# Patient Record
Sex: Female | Born: 1950 | Race: White | Hispanic: No | Marital: Single
Health system: Southern US, Community
[De-identification: ages and names within clinical notes are randomized; demographics above are authoritative.]

---

## 2000-12-24 ENCOUNTER — Other Ambulatory Visit: Admission: RE | Admit: 2000-12-24 | Discharge: 2000-12-24 | Payer: Self-pay | Admitting: Family Medicine

## 2001-05-09 ENCOUNTER — Emergency Department (HOSPITAL_COMMUNITY): Admission: EM | Admit: 2001-05-09 | Discharge: 2001-05-09 | Payer: Self-pay | Admitting: Emergency Medicine

## 2001-05-09 ENCOUNTER — Encounter: Payer: Self-pay | Admitting: Emergency Medicine

## 2001-08-19 ENCOUNTER — Ambulatory Visit (HOSPITAL_BASED_OUTPATIENT_CLINIC_OR_DEPARTMENT_OTHER): Admission: RE | Admit: 2001-08-19 | Discharge: 2001-08-19 | Payer: Self-pay | Admitting: Plastic Surgery

## 2002-02-02 ENCOUNTER — Encounter: Payer: Self-pay | Admitting: Family Medicine

## 2002-02-02 ENCOUNTER — Encounter: Admission: RE | Admit: 2002-02-02 | Discharge: 2002-02-02 | Payer: Self-pay | Admitting: Family Medicine

## 2002-07-22 ENCOUNTER — Ambulatory Visit (HOSPITAL_COMMUNITY): Admission: RE | Admit: 2002-07-22 | Discharge: 2002-07-22 | Payer: Self-pay | Admitting: Gastroenterology

## 2005-09-17 ENCOUNTER — Emergency Department (HOSPITAL_COMMUNITY): Admission: EM | Admit: 2005-09-17 | Discharge: 2005-09-17 | Payer: Self-pay | Admitting: Emergency Medicine

## 2005-09-27 ENCOUNTER — Ambulatory Visit: Payer: Self-pay | Admitting: Internal Medicine

## 2005-10-05 ENCOUNTER — Ambulatory Visit: Payer: Self-pay

## 2005-10-05 ENCOUNTER — Encounter: Payer: Self-pay | Admitting: Cardiology

## 2005-10-15 ENCOUNTER — Ambulatory Visit: Payer: Self-pay | Admitting: Internal Medicine

## 2005-10-15 ENCOUNTER — Ambulatory Visit: Payer: Self-pay | Admitting: Cardiology

## 2005-10-25 ENCOUNTER — Ambulatory Visit: Payer: Self-pay | Admitting: Cardiology

## 2005-11-01 ENCOUNTER — Ambulatory Visit: Payer: Self-pay | Admitting: Cardiology

## 2005-11-06 ENCOUNTER — Ambulatory Visit: Payer: Self-pay | Admitting: Internal Medicine

## 2005-11-08 ENCOUNTER — Ambulatory Visit: Payer: Self-pay | Admitting: Cardiology

## 2005-11-12 ENCOUNTER — Ambulatory Visit (HOSPITAL_COMMUNITY): Admission: AD | Admit: 2005-11-12 | Discharge: 2005-11-13 | Payer: Self-pay | Admitting: Internal Medicine

## 2005-11-12 ENCOUNTER — Ambulatory Visit: Payer: Self-pay | Admitting: Internal Medicine

## 2005-11-14 ENCOUNTER — Ambulatory Visit: Payer: Self-pay | Admitting: Cardiology

## 2005-11-26 ENCOUNTER — Ambulatory Visit: Payer: Self-pay | Admitting: Internal Medicine

## 2005-12-13 ENCOUNTER — Ambulatory Visit: Payer: Self-pay | Admitting: Internal Medicine

## 2005-12-13 ENCOUNTER — Ambulatory Visit: Payer: Self-pay | Admitting: Cardiology

## 2007-01-16 ENCOUNTER — Ambulatory Visit: Payer: Self-pay | Admitting: Internal Medicine

## 2007-01-21 ENCOUNTER — Ambulatory Visit: Payer: Self-pay

## 2009-03-15 ENCOUNTER — Telehealth (INDEPENDENT_AMBULATORY_CARE_PROVIDER_SITE_OTHER): Payer: Self-pay | Admitting: *Deleted

## 2009-09-30 ENCOUNTER — Encounter: Admission: RE | Admit: 2009-09-30 | Discharge: 2009-09-30 | Payer: Self-pay | Admitting: Internal Medicine

## 2010-09-05 NOTE — Assessment & Plan Note (Signed)
[facility]                         ELECTROPHYSIOLOGY OFFICE NOTE   EDRIS, SCHNECK                       MRN:          [id number]  DATE:01/16/2007                            DOB:          1950/09/13    [name] returns today after over a year's absence.  She is a very  pleasant 60 year old woman with a history of electrophysiologic study  and catheter ablation of atrial flutter which was carried out back in  July 2007.  We stopped her Coumadin and she did well.  She notes and is  here today after being re-referred by Dr. Duaine Dredge because she notes an  episode of substernal chest pressure which occurred very briefly when  she overdid it while walking several weeks ago.  Since then she has  continued to walk on a regular basis and had no significant symptoms.  Otherwise she had no specific complaints today.  She has had no syncope.  She does note when she exercises hard that her heart speeds up but then  slows back down.  She thinks that the heart rate, however, is faster  than normal.   PHYSICAL EXAMINATION:  GENERAL:  She is a pleasant well-appearing woman  in no acute distress.  VITAL SIGNS:  Blood pressure 112/78, pulse 84 and regular, respirations  16, weight 114 pounds.  NECK:  Revealed no jugular venous distention.  LUNGS:  Clear bilaterally to auscultation.  No wheezes, rales, or  rhonchi present.  CARDIOVASCULAR:  Revealed a regular rate and rhythm with normal S1 and  S2.  EXTREMITIES:  Demonstrated no edema.   EKG demonstrates a sinus rhythm with normal axes and intervals.   IMPRESSION:  1. Symptomatic atrial flutter, status post ablation.  2. New onset of exertional chest pain.   DISCUSSION:  Overall, Tara Orr is fairly stable but because of the  exertional chest pain and because of her history of known heart disease  with prior atrial flutter, I will plan on having her obtain an exercise  Myoview stress test.  Additional  recommendations will be based on the  results of her stress test.     Doylene Canning. Ladona Ridgel, MD  Electronically Signed    GWT/MedQ  DD: 01/16/2007  DT: 01/16/2007  Job #: [id number]   cc:   Mosetta Putt, M.D.

## 2010-09-08 NOTE — Op Note (Signed)
NAMEGEONNA, LOCKYER                ACCOUNT NO.:  [id number]   MEDICAL RECORD NO.:  [id number]          PATIENT TYPE:  INP   LOCATION:  2807                         FACILITY:  [facility]   PHYSICIAN:  Doylene Canning. Ladona Ridgel, M.D.  DATE OF BIRTH:  [birth year]   DATE OF PROCEDURE:  11/12/2005  DATE OF DISCHARGE:                                 OPERATIVE REPORT   PROCEDURE PERFORMED:  Electrophysiologic study and RF catheter ablation of  atrial flutter.   INDICATION:  Symptomatic atrial flutter.   INTRODUCTION:  The patient is a 60 year old woman who has a history of  atrial flutter in the past.  She has a history of depression and difficulty  with sleeping.  She was subsequently found to have atrial flutter with  preserved LV function.  She is referred for electrophysiologic study and  catheter ablation of her flutter.   PROCEDURE:  After informed consent was obtained, the patient taken  diagnostic EP lab in fasting state.  After usual preparation and draping,  intravenous fentanyl and Midazolam was given for sedation.  A 6-French  hexapolar catheter was inserted percutaneously in the right jugular vein and  advanced coronary sinus.  A 5-French quadripolar catheter was inserted  percutaneously in the right femoral vein and advanced the His bundle region.  A 7-French 20 pole halo catheter was inserted percutaneously in the right  femoral vein and advanced the right atrium.  Mapping was carried out  demonstrating clockwise atrial flutter cycle length of 230 milliseconds.  Mapping subsequently demonstrated a fairly small atrial flutter isthmus and  right atrium in general.  The 7-French quadripolar ablation catheter was  inserted into the right femoral vein and advanced into the right atrium.  Mapping was again carried out and a single RF energy application was  delivered to the isthmus between the tricuspid annulus and the eustachian  ridge resulting in termination of atrial flutter, restoration  sinus rhythm.  Pacing was then carried out from the coronary sinus demonstrating isthmus  conduction be present.  A single RF energy application was then delivered  resulting in isthmus block.  A final bonus RF energy application was then  delivered, the patient was observed for 30 minutes.  During this time, rapid  ventricular pacing was carried out from the RV apex and stepwise decreased  down to 300 milliseconds where VA conduction remained present.  The atrial  activation was midline and decremental.  Next programmed ventricular  stimulation was carried out from the RV apex at base drive cycle of 161  milliseconds with the S1-S2 interval stepwise decreased from 400  milliseconds down to 230 milliseconds where the retrograde AV node ERP was  observed.  During programmed atrial stimulation there no AH jumps no echo  beats.  Next rapid atrial pacing was carried out from the coronary sinus at  a pace cycle length 500 milliseconds stepwise decreased down to 410  milliseconds where AV Wenckebach was observed.  During rapid atrial pacing  the PR interval was less than the RR interval and there was no inducible  SVT.  Next programmed atrial stimulation  was carried out from the coronary  sinus and high right atrium base drive cycle length of 478 milliseconds.  The S1-S2 interval stepwise decreased from 440 milliseconds down to 320  milliseconds where the AV node ERP was observed.  During programmed atrial  stimulation there no AH jumps and no echo beats.  At this point a total of  30 minutes gone since development of isthmus block and rapid pacing  demonstrated no isthmus conduction.  The catheters were removed.  Hemostasis  was assured and the patient was returned to her room in satisfactory  condition.   COMPLICATIONS:  There were no immediate procedure complications.   RESULTS:  A.  Baseline ECG.  The baseline ECG demonstrates atrial flutter  with a variable ventricular response.  B.   Baseline intervals.  HV interval was 56 milliseconds, the atrial flutter  cycle length 230 milliseconds.  E.  Rapid ventricular pacing.  Following ablation, rapid ventricular pacing  was carried out from the RV apex and decremented down to 300 milliseconds  where VA conduction was persistent.  The atrial activation was midline  decremental.  D.  Programmed ventricular stimulation.  Programmed ventricular stimulation  was carried out from the RV apex at base drive cycle of 295 milliseconds.  The S1-S2 interval was stepwise decreased down to 30 milliseconds where the  retrograde AV node ERP was observed.  During programmed ventricular  stimulation, the atrial activation was midline decremental.  E.  Rapid atrial pacing.  Rapid atrial pacing was carried out from the  coronary sinus as well as from the high right atrium at base drive cycle  length of 621 milliseconds and stepwise decreased down to 410 milliseconds  where AV Wenckebach was observed.  During rapid atrial pacing the PR  interval was less than the RR interval and there is no inducible SVT.  F.  Programmed atrial stimulation.  Programmed atrial stimulation was  carried out from the coronary sinus and high right atrium at base drive  cycle length 308 milliseconds.  The S1-S2 interval was stepwise decreased  down to 320 milliseconds where AV node ERP was observed.  During programmed  atrial stimulation there no AH jumps and echo beats and no inducible SVT.  G.  Arrhythmias observed.  1.  Atrial flutter.  Initiation present time of EP study.  Duration was      sustained.  Termination catheter ablation cycle length 232 milliseconds.      Activation was clockwise around tricuspid annulus.      1.  Mapping.  Mapping of atrial flutter demonstrated small size and          normal orientation of the atrial flutter isthmus.  Atrial flutter          was clockwise and orientation.         1.  RF energy application.  A total of three RF energy  applications              including one bonus RF energy application was delivered.  During              the first RF energy application, atrial flutter was terminated              and sinus rhythm restored.  During the second RF energy              application atrial flutter isthmus block was demonstrated and a  final bonus RF energy application was subsequently delivered.   CONCLUSIONS:  Study demonstrates successful electrophysiologic study and RF  catheter ablation of clockwise atrial flutter with a total of three RF  energy applications to the usual atrial flutter isthmus resulting  termination of flutter, restoration of sinus rhythm, and creation of  bidirectional block in the atrial flutter isthmus.           ______________________________  Doylene Canning. Ladona Ridgel, M.D.     GWT/MEDQ  D:  11/12/2005  T:  11/12/2005  Job:  [id number]   cc:   Mosetta Putt, M.D.  Fax: [phone]   Pricilla Riffle, M.D.  [address]  [address]  [place]  [place] [address]

## 2010-09-08 NOTE — Discharge Summary (Signed)
NAMECHARLANE, Tara Orr                ACCOUNT NO.:  [id number]   MEDICAL RECORD NO.:  [id number]          PATIENT TYPE:  INP   LOCATION:  4707                         FACILITY:  [facility]   PHYSICIAN:  Maple Mirza, P.A. DATE OF BIRTH:  [birth year]   DATE OF ADMISSION:  11/12/2005  DATE OF DISCHARGE:  11/13/2005                                 DISCHARGE SUMMARY   ALLERGIES:  SHE HAS NO KNOWN DRUG ALLERGIES.   TIME FOR THIS DICTATION:  30 minutes.   PRINCIPAL DIAGNOSES:  1.  Clockwise atrial flutter, mildly symptomatic.  2.  Discharge day 1 status post electrophysiology study/radiofrequency      catheter ablation of clockwise atrial flutter with block at the      cavotricuspid isthmus.  3.  Dyspnea on exertion.  4.  History of palpitations.   SECONDARY DIAGNOSES:  1.  Depression.  2.  Migraines.  3.  History of melanoma.  4.  Constipation.  5.  Sleep disorders.   PROCEDURE:  November 12, 2005:  Electrophysiology study radiofrequency catheter  ablation of clockwise atrial flutter, Dr. Lewayne Bunting.   BRIEF HISTORY:  Tara Orr is a 60 year old female.  She has a history of  palpitations.  She has a sensation that her heart races, although this is  not particularly bothersome.  She was seen by Dr. Tenny Craw, and  electrocardiogram showed what appeared to be clockwise atrial flutter.   The patient states that when she walks she gets dyspneic, but has  experienced this most of her adult life.  She recently had a 2D  echocardiogram, which showed preserved left ventricular function.  There  were no significant valvular abnormalities.  The patient never had syncope.   With regard to atrial flutter, treatment options have been discussed with  the patient, including medical therapy and the need for long-term Coumadin.  The alternative would be radiofrequency catheter ablation with 70-month  followup on Coumadin.  Patient has considered her options and decided to  undergo radiofrequency catheter  ablation.   HOSPITAL COURSE:  Patient presented electively on July 23rd.  She underwent  radiofrequency catheter ablation of a clockwise atrial flutter.  The same  day, she was maintaining sinus rhythm after the ablation.  She has no  complaints and no complications.  Her INR at discharge was 3.1.  She is  asked not to drive for the next 2 days and not to lift anything heavier than  10 pounds for the next 2 weeks.  If she plans dental work even just teeth  cleaning through January 2008, she is to call [phone] for antibiotic  coverage.   Her discharge medications now include:  1.  Omeprazole 20 mg daily  2.  Desipramine 20 mg daily.  3.  Tranxene 15 mg daily at bedtime.  4.  GlycoLax 18 mg and 8 ounces of water daily.  5.  Coumadin 5 mg daily except 2.5 mg Tuesday.  6.  Relpax 40 mg daily.  7.  Glucosamine sulfate 2000 mg daily.  8.  Calcium with vitamin D 1000 mg daily.   She is to  stop taking metoprolol 12.5 mg daily, to stop Lanoxin 0.25 mg  daily.  She will follow up at the Coumadin clinic Wednesday, July 25th at  11:15 and she will see Dr. Ladona Ridgel Thursday, August 23rd at 9:45.   Laboratory studies include serum electrolytes, sodium 138, potassium 3.9,  chloride 101, carbonate 35.  BUN is 10, creatinine 1, glucose 95.  Free T4  is 1.23.  T3 is 38.   Once again, 30 minutes for this preparation and discharge.      Maple Mirza, P.A.     GM/MEDQ  D:  11/13/2005  T:  11/13/2005  Job:  [id number]   cc:   Mosetta Putt, M.D.  Fax: [phone]   Doylene Canning. Ladona Ridgel, M.D.  [address]  [address]  [place]  [place] [address]

## 2010-09-08 NOTE — Assessment & Plan Note (Signed)
[facility]                                   ON-CALL NOTE   Tara Orr, Tara Orr                         MRN:          [id number]  DATE:11/09/2005                            DOB:          [birth year]    PROBLEM:  [name] called me this evening inquiring about clarification for  a medicine she received from our office.  Apparently, she is scheduled for  an ablation on Monday with Dr. Ladona Ridgel.  She is on Coumadin, had an INR of  2.0 yesterday, and was contacted on two separate occasions regarding  instructions for further Coumadin dosing.  She tells that Dr. Ladona Ridgel wants  her INR to be between 2.0 and 2.5.   The patient initially stated that she was told to take 5 mg this evening,  and then 2.5 mg over the weekend.  However, a second call told her that  following review by Dr. Ladona Ridgel, the recommendation is to continue on  Coumadin as scheduled, and to take a 2.5 mg dose on Sunday.   We reviewed both sets of instructions, and I felt that based on what she was  telling me, the current recommendation is to continue her Coumadin as is, at  5 mg daily, and then to cut down to 2.5 mg tomorrow.  She agreed that this  was her impression as well, and thanked me for the clarification.                                   Gene Serpe, PA-C    GS/MedQ  DD:  11/09/2005  DT:  11/10/2005  Job #:  [id number]

## 2010-09-08 NOTE — Assessment & Plan Note (Signed)
[facility]                           ELECTROPHYSIOLOGY OFFICE NOTE   [name], [name]                       MRN:          [id number]  DATE:12/13/2005                            DOB:          08-Apr-1951    HISTORY:  Tara Orr returns today for followup.  She is a very pleasant  middle-aged woman with a history of atrial flutter who underwent  electrophysiology study and catheter ablation several weeks ago.  At that  time, she was found to have clockwise atrial flutter and underwent  successful ablation, restored to sinus rhythm.  She returns for followup.  She has very minimal palpitations but notes that mainly she has the  sensation that her heart is being when she turns onto the left side while  she is sleeping.  We initially put her back on the metoprolol.  She returns  for followup.   EXAMINATION:  GENERAL:  She is a pleasant, well-appearing woman in no  distress.  VITAL SIGNS:  Blood pressure 102/68, pulse 73 and regular, respirations 18,  weight 127 pounds.  NECK:  Revealed no jugular venous distention.  LUNGS:  Clear bilaterally to auscultation.  CARDIOVASCULAR:  Regular rate and rhythm with normal S1 and S2.  EXTREMITIES:  No edema.   LABORATORY DATA:  EKG demonstrates sinus rhythm with normal axis and  intervals.   IMPRESSION:  1. Clockwise atrial flutter.  2. Status post catheter ablation.  3. Chronic Coumadin therapy.   DISCUSSION:  Overall, Tara Orr is stable today.  She is maintaining sinus  rhythm very nicely and I have recommended that she stop her Coumadin and  stop her beta blocker and follow up with [place] on a p.r.n. basis.                                   Doylene Canning. Ladona Ridgel, MD   GWT/MedQ  DD:  12/13/2005  DT:  12/13/2005  Job #:  [id number]   cc:   Mosetta Putt, MD

## 2010-09-08 NOTE — Op Note (Signed)
NAMEJUDIE, Tara Orr                            ACCOUNT NO.:  [id number]   MEDICAL RECORD NO.:  [id number]                   PATIENT TYPE:  AMB   LOCATION:  ENDO                                 FACILITY:   PHYSICIAN:  Anselmo Rod, M.D.               DATE OF BIRTH:  Oct 04, 1950   DATE OF PROCEDURE:  07/22/2002  DATE OF DISCHARGE:                                 OPERATIVE REPORT   PROCEDURE:  Screening colonoscopy.   ENDOSCOPIST:  Anselmo Rod, M.D.   INSTRUMENT USED:  Pediatric adjustable Olympus colonoscope.  We changed to  an adult colonoscope.   INDICATIONS FOR PROCEDURE:  A 59 year old white female undergoing screening  colonoscopy.  Rule out colonic polyps, masses, etc.  There is a family  history of colon cancer.   PREPROCEDURE PREPARATION:  Informed consent was procured from the patient.  The patient was fasted for eight hours prior to the procedure and prepped  with a bottle of magnesium citrate and a gallon of GoLYTELY the night prior  to the procedure.   PREPROCEDURE PHYSICAL:  VITAL SIGNS:  The patient had stable vital signs.  NECK:  Supple.  CHEST:  Clear to auscultation.  CARDIAC:  S1 and S2 regular.  ABDOMEN:  Soft with normal bowel sounds.   DESCRIPTION OF PROCEDURE:  The patient was placed in the left lateral  decubitus position and sedated with 100 mg of Demerol and 10 mg of Versed  intravenously.  Once the patient was adequately sedated, maintained on low  flow oxygen and continuous cardiac monitoring, the Olympus video colonoscope  was advanced from the rectum to the cecum and terminal ileum with some  difficulty.  The pediatric adjustable colonoscope could not be advanced  around the hepatic flexure __________ in spite of changing the patient's  position from the left lateral to the supine and the right lateral position.  Therefore, this was withdrawn and adult colonoscope was used instead.  The  procedure was repeated and with gentle abdominal  pressure, the scope was  advanced up to the cecum and terminal ileum.  The appendiceal orifice and  ileocecal valve were clearly visualized and photographed.  No masses,  polyps, erosions, ulcerations, or diverticula were seen.  Prominent internal  hemorrhoids were seen on retroflexion in the rectum.  A small external  hemorrhoid was seen on anal inspection.  The patient tolerated the procedure  well without complications.   IMPRESSION:  Essentially normal colonoscopy up to the terminal ileum except  for internal and external hemorrhoids.   RECOMMENDATIONS:  1. A high-fiber diet has been recommended along with liberal fluid intake.  2.     Repeat colorectal cancer screening as recommended in the next 5 years unless     the patient develops any abnormal symptoms.  3. Outpatient followup on p.r.n. basis.  Anselmo Rod, M.D.    JNM/MEDQ  D:  07/22/2002  T:  07/22/2002  Job:  [id number]   cc:   Mosetta Putt, M.D.  [address] [place]  [place] [address]  Fax: [phone]

## 2010-09-08 NOTE — Op Note (Signed)
[facility]  Patient:    Tara Orr, Tara Orr Visit Number: [id number] MRN: [id number]          Service Type: DSU Location: [facility] Attending Physician:  Eloise Levels Dictated by:   Mary A. Contogiannis, M.D. Proc. Date: 08/19/01 Admit Date:  08/19/2001                             Operative Report  PREOPERATIVE DIAGNOSES:  1. Lentigo maligna, left clavicle.  2. Suspicious skin lesion, left breast.  3. Suspicious skin lesion, suprapubic area.  POSTOPERATIVE DIAGNOSES:  1. Lentigo maligna, left clavicle.  2. Suspicious skin lesion, left breast.  3. Suspicious skin lesion, suprapubic area.  OPERATION/PROCEDURE:  1. Wide local excision of 2.5 cm left clavicle lentigo maligna.  2. Complex closure of 5.5 cm left clavicle incision.  3. Excision of 0.8 cm suspicious left breast skin lesion.  4. Excision of 0.8 cm suspicious suprapubic skin lesion.  SURGEON: Mary A. Contogiannis, M.D.  ANESTHESIA: Lidocaine 1% with epinephrine.  COMPLICATIONS: None.  INDICATIONS FOR PROCEDURE: The patient is a 60 year old Caucasian female, who has a biopsy-proven lentigo maligna of the left clavicle.  Dr. Danella Deis has examined the patient and also feels that there is a left breast as well as a suprapubic area of skin lesions that are suspicious clinically.  The patient will therefore undergo excisional biopsies of these skin lesions along with the wide local excision of the lentigo maligna.  She requested that we proceed with surgery at this time.  PROCEDURE: The patient was brought to the minor room and placed on the table in the supine position.  The left clavicular and chest area were prepped with Betadine and draped in sterile fashion.  The skin and subcutaneous tissues in the area of the skin lesions were then injected with 1% lidocaine with epinephrine.  After adequate hemostasis and anesthesia had taken effect the procedure was begun.  First the lentigo  maligna at the left clavicle was excised with 5 mm skin margins.  This tissue was excised full-thickness through the subcutaneous tissues down to the superficial fascial layer.  This specimen was marked at the 12 oclock position and passed off the table to undergo permanent pathologic section evaluation.  The skin and soft tissues were undermined for closure.  The fascial layer was then closed using 3-0 Monocryl suture.  The dermal layer was then closed using 3-0 Monocryl interrupted suture.  The skin was then closed using a 4-0 Monocryl running intracuticular stitch.  Attention was then turned to the left breast.  The suspicious skin lesion was excised with 1 mm of normal skin around it.  The skin lesion was marked at the 12 oclock position and passed off the table to undergo permanent pathologic section evaluation.  The dermal layer was then closed using 3-0 Monocryl suture.  The skin was then closed using a 4-0 Monocryl in an intracuticular stitch.  Both of these incisions were dressed with benzoin and Steri-Strips.  There were no complications.  Attention was then turned to the suprapubic area.  This area was prepped with Betadine and draped in sterile fashion.  The skin and subcutaneous tissues in the area of the skin lesion were then injected with 1% lidocaine with epinephrine.  After adequate hemostasis and anesthesia had taken effect the procedure was begun. The skin lesion was excised with 1 mm of normal skin around it.  This was done full-thickness  through the skin and subcutaneous tissues.  The specimen was marked at 12 oclock position and passed off the table to undergo permanent pathologic section evaluation.  The wound was then closed in simple fashion. The skin dermal layer was closed using 3-0 Monocryl suture.  The skin was then closed using a 6-0 Prolene running baseball-type stitch.  All of the incisions were dressed with benzoin and Steri-Strips.  There were no  complications.  The patient tolerated the procedure well.  She was then taught proper wound care and discharged home in stable condition.  Follow-up appointment will be tomorrow in the office. Dictated by:   Mary A. Contogiannis, M.D. Attending Physician:  Eloise Levels DD:  08/19/01 TD:  08/20/01 Job: [id number]

## 2012-12-09 ENCOUNTER — Other Ambulatory Visit (HOSPITAL_COMMUNITY): Payer: Self-pay | Admitting: Chiropractic Medicine

## 2012-12-09 ENCOUNTER — Ambulatory Visit (HOSPITAL_COMMUNITY)
Admission: RE | Admit: 2012-12-09 | Discharge: 2012-12-09 | Disposition: A | Discharge status: Home / Self Care | Payer: BC Managed Care – PPO | Source: Ambulatory Visit | Attending: Chiropractic Medicine | Admitting: Chiropractic Medicine

## 2012-12-09 DIAGNOSIS — M47817 Spondylosis without myelopathy or radiculopathy, lumbosacral region: Secondary | ICD-10-CM | POA: Insufficient documentation

## 2012-12-09 DIAGNOSIS — M5137 Other intervertebral disc degeneration, lumbosacral region: Secondary | ICD-10-CM | POA: Insufficient documentation

## 2012-12-09 DIAGNOSIS — M545 Low back pain: Secondary | ICD-10-CM

## 2012-12-09 DIAGNOSIS — M51379 Other intervertebral disc degeneration, lumbosacral region without mention of lumbar back pain or lower extremity pain: Secondary | ICD-10-CM | POA: Insufficient documentation

## 2015-05-18 IMAGING — CR DG LUMBAR SPINE COMPLETE 4+V
5 series · 5 of 5 positions shown · non-contrast
Comparison: None.

CLINICAL DATA: Low back pain.  No known injury.

LUMBAR SPINE - COMPLETE 4+ VIEW

[t l-spine a.p.]
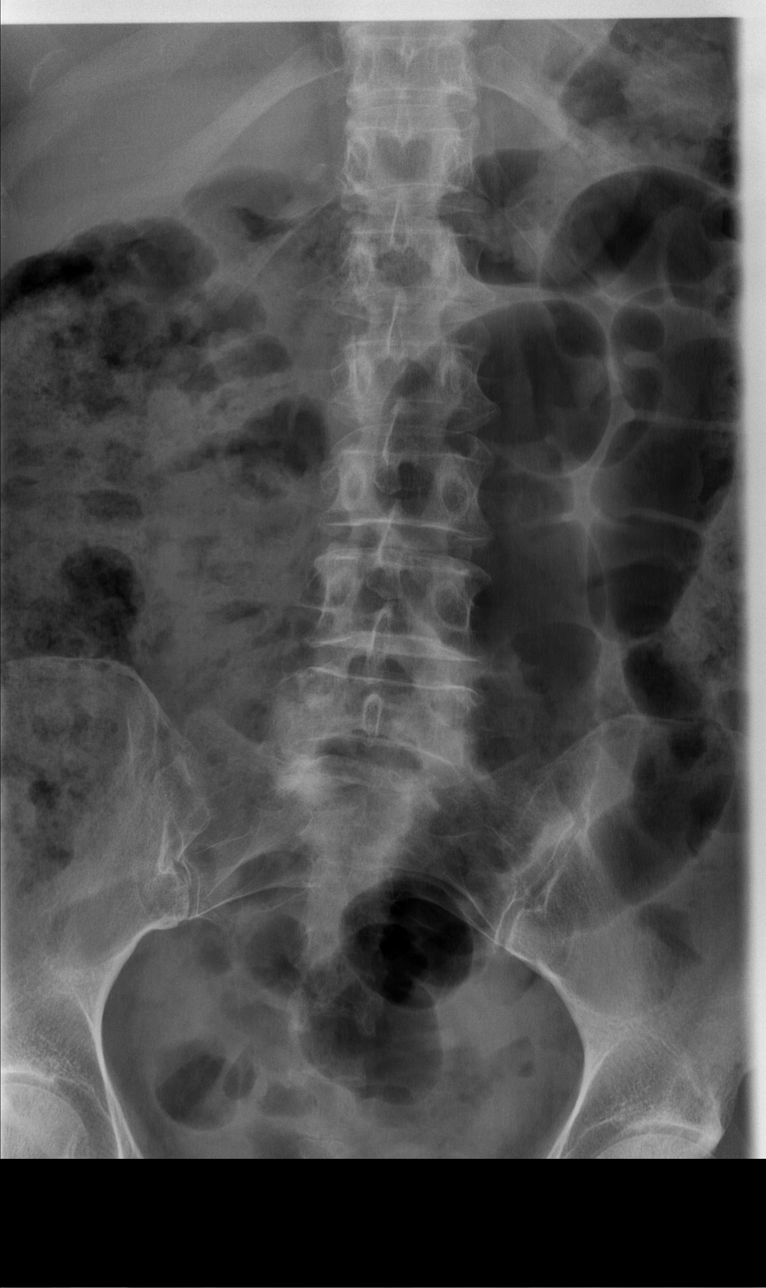

[t l-spine oblique exposure (1 of 2)]
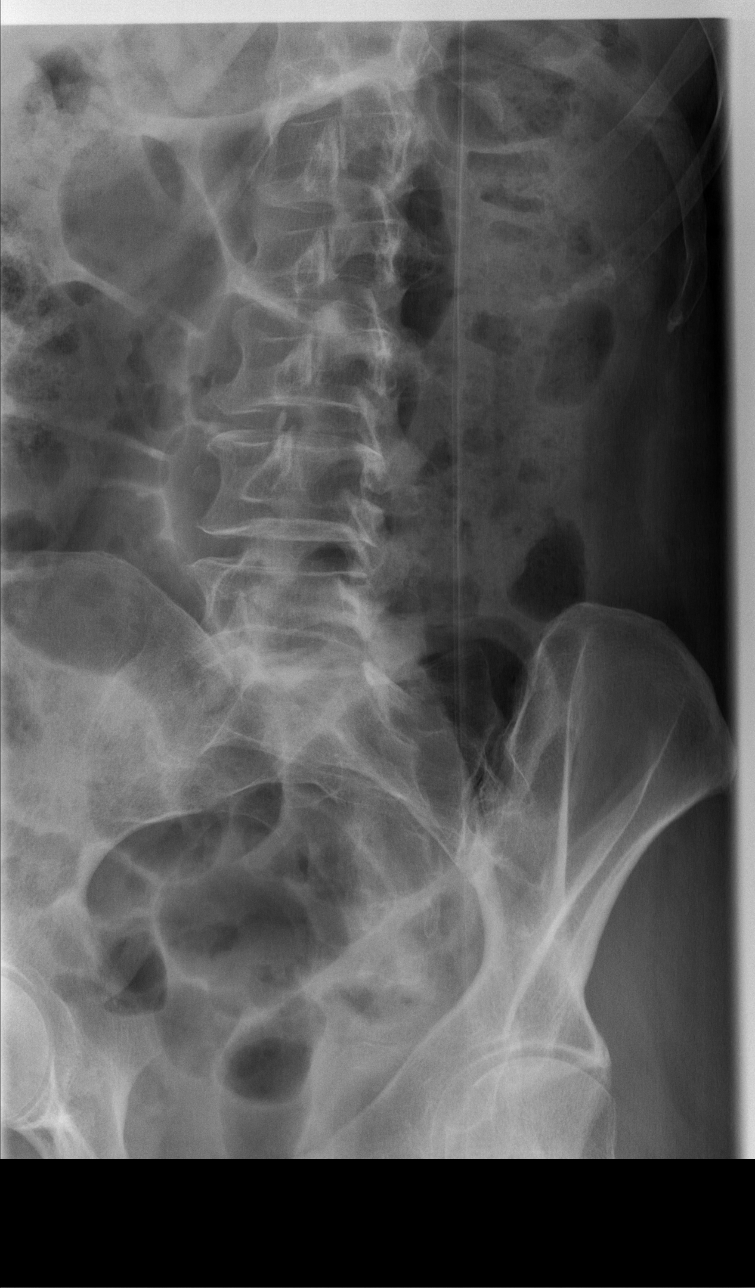

[t l-spine oblique exposure (2 of 2)]
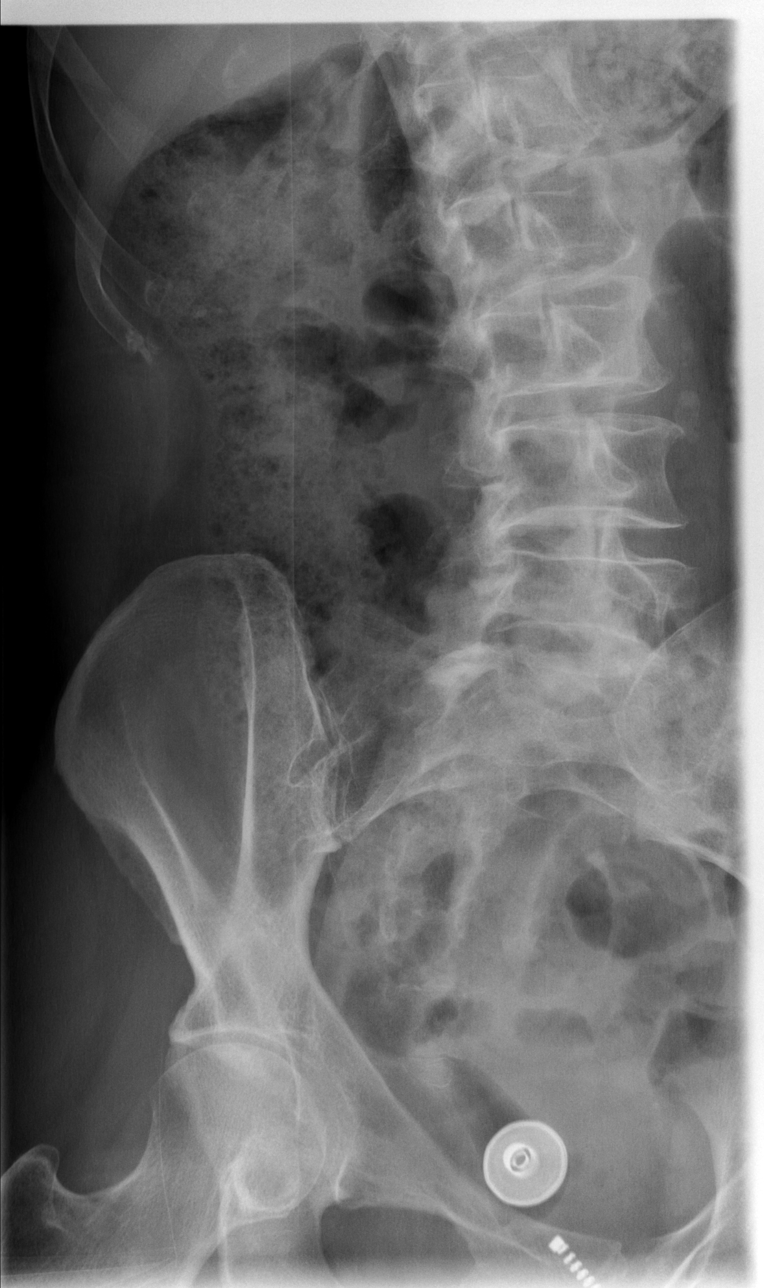

[t l-spine lat]
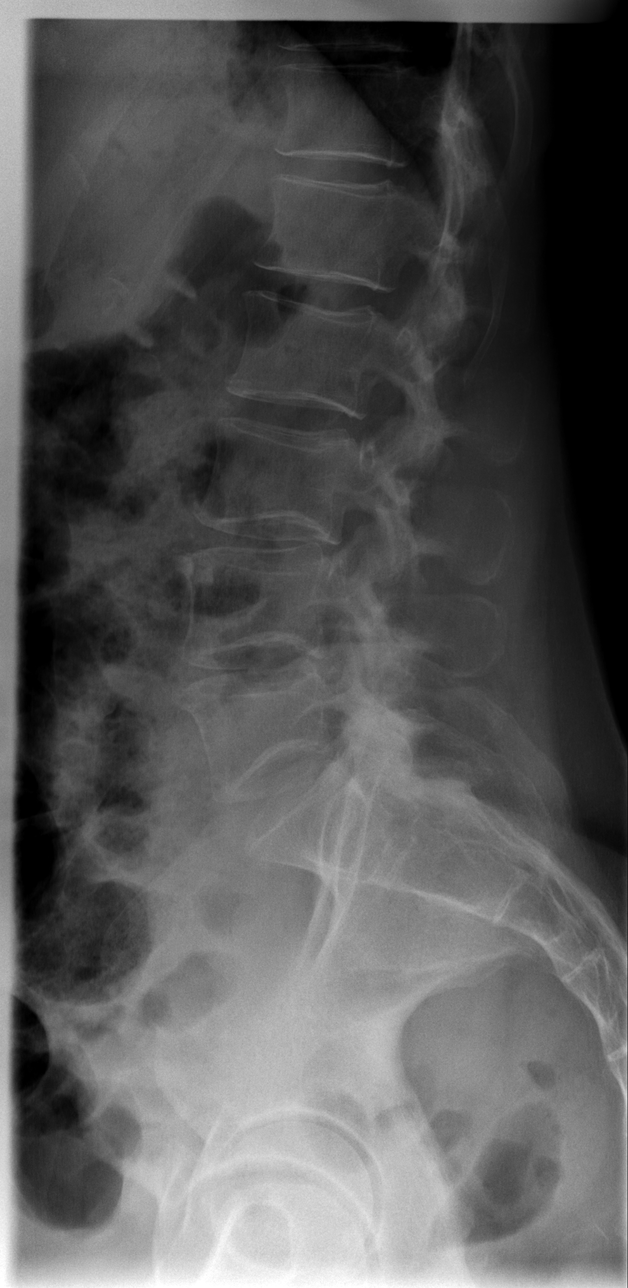

[t l-spine l5-s1 spot]
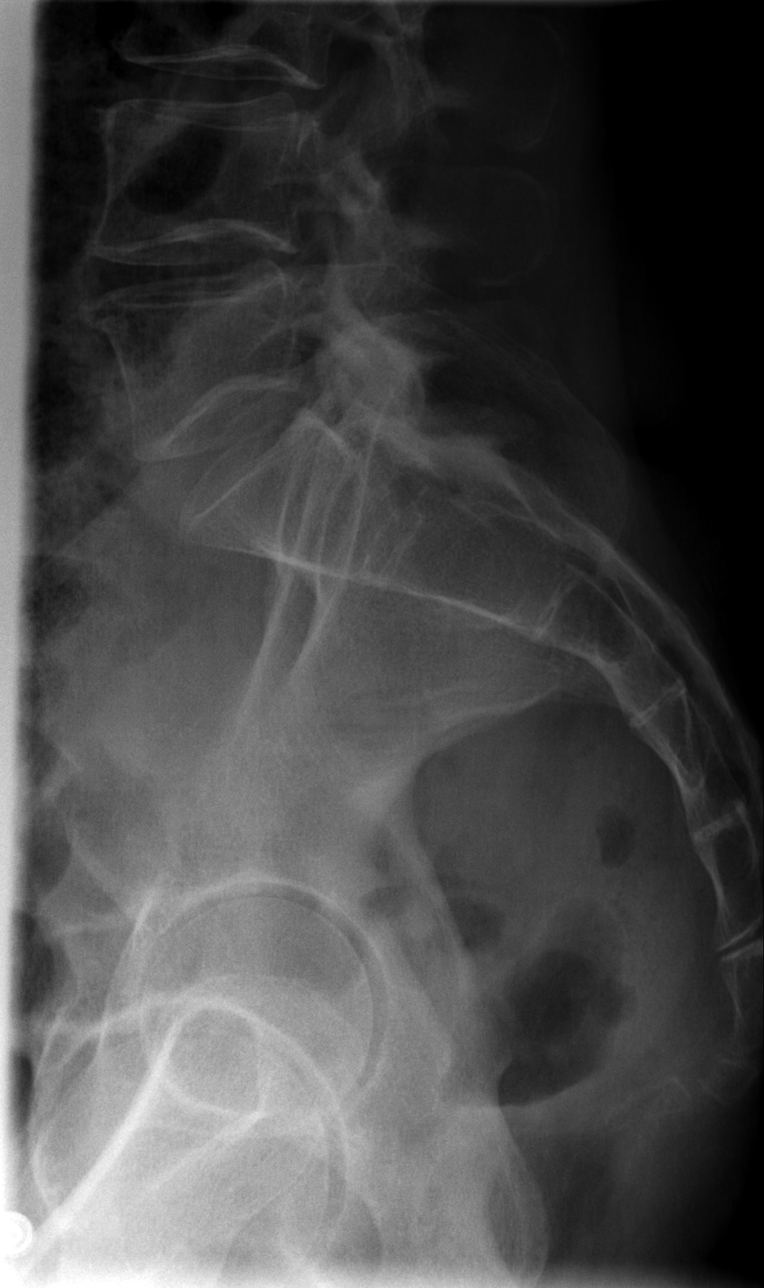

[5 of 5 positions shown; findings below may reference images not displayed]

FINDINGS: No evidence of acute fracture, spondylolysis, or
spondylolisthesis.]

Mild degenerative disc disease seen at levels of L3-4 and L4-5.
Moderate to severe facet DJD is seen bilaterally at L5-S1.  No
focal lytic or sclerotic bone lesions identified.
IMPRESSION: 1.  No acute findings.
2. Lower lumbar spondylosis, as described above.

## 2017-08-02 ENCOUNTER — Other Ambulatory Visit: Payer: Self-pay | Admitting: Family Medicine

## 2017-08-02 ENCOUNTER — Other Ambulatory Visit: Payer: Self-pay

## 2017-08-02 DIAGNOSIS — R42 Dizziness and giddiness: Secondary | ICD-10-CM

## 2019-05-11 ENCOUNTER — Telehealth: Payer: Self-pay

## 2019-05-11 NOTE — Telephone Encounter (Signed)
I received a fax from [facility] [employer]/access nurse that pt wanted to cancel covid vaccine at [facility]; no PCP noted. Pt has not been seen by [facility]. I notified pt to call [phone] to cancel appt which is on appt info from [facility]. Pt voiced udnerstanding and will call to cancel appt. Copy of [facility] note sent for scanning.

## 2019-05-21 ENCOUNTER — Ambulatory Visit: Payer: Medicare PPO

## 2019-05-28 ENCOUNTER — Ambulatory Visit: Payer: Medicare PPO | Attending: Internal Medicine

## 2019-05-28 DIAGNOSIS — Z23 Encounter for immunization: Secondary | ICD-10-CM | POA: Insufficient documentation

## 2019-05-28 NOTE — Progress Notes (Signed)
   Covid-19 Vaccination Clinic  Name:  KAELEA GATHRIGHT    MRN: [id number] DOB: [birth year]  05/28/2019  Ms. Crutchley was observed post Covid-19 immunization for  15 minutes without incidence. She was provided with Vaccine Information Sheet and instruction to access the V-Safe system.   Ms. Landers was instructed to call 911 with any severe reactions post vaccine: Marland Kitchen Difficulty breathing  . Swelling of your face and throat  . A fast heartbeat  . A bad rash all over your body  . Dizziness and weakness    Immunizations Administered    Name Date Dose VIS Date Route   Pfizer COVID-19 Vaccine 05/28/2019 12:57 PM 0.3 mL 04/03/2019 Intramuscular   Manufacturer: [employer], [employer]   Lot: [id number]   NDC: [address]-7

## 2019-06-01 ENCOUNTER — Ambulatory Visit: Payer: Medicare PPO

## 2019-06-12 ENCOUNTER — Ambulatory Visit: Payer: Self-pay

## 2019-06-22 ENCOUNTER — Ambulatory Visit: Payer: Medicare PPO | Attending: Internal Medicine

## 2019-06-22 DIAGNOSIS — Z23 Encounter for immunization: Secondary | ICD-10-CM | POA: Insufficient documentation

## 2019-06-22 NOTE — Progress Notes (Signed)
   Covid-19 Vaccination Clinic  Name:  Tara Orr    MRN: [id number] DOB: Oct 17, 1950  06/22/2019  Tara Orr was observed post Covid-19 immunization for 15 minutes without incidence. She was provided with Vaccine Information Sheet and instruction to access the V-Safe system.   Tara Orr was instructed to call 911 with any severe reactions post vaccine: Marland Kitchen Difficulty breathing  . Swelling of your face and throat  . A fast heartbeat  . A bad rash all over your body  . Dizziness and weakness    Immunizations Administered    Name Date Dose VIS Date Route   Pfizer COVID-19 Vaccine 06/22/2019  3:53 PM 0.3 mL 04/03/2019 Intramuscular   Manufacturer: [employer], [employer]   Lot: [id number]   NDC: [address]-6

## 2019-10-06 DIAGNOSIS — R002 Palpitations: Secondary | ICD-10-CM | POA: Diagnosis not present

## 2019-11-03 DIAGNOSIS — H35371 Puckering of macula, right eye: Secondary | ICD-10-CM | POA: Diagnosis not present

## 2019-11-03 DIAGNOSIS — H52221 Regular astigmatism, right eye: Secondary | ICD-10-CM | POA: Diagnosis not present

## 2019-11-03 DIAGNOSIS — H5203 Hypermetropia, bilateral: Secondary | ICD-10-CM | POA: Diagnosis not present

## 2019-11-03 DIAGNOSIS — H524 Presbyopia: Secondary | ICD-10-CM | POA: Diagnosis not present

## 2019-11-03 DIAGNOSIS — H2513 Age-related nuclear cataract, bilateral: Secondary | ICD-10-CM | POA: Diagnosis not present

## 2019-11-25 DIAGNOSIS — F331 Major depressive disorder, recurrent, moderate: Secondary | ICD-10-CM | POA: Diagnosis not present

## 2019-11-25 DIAGNOSIS — F411 Generalized anxiety disorder: Secondary | ICD-10-CM | POA: Diagnosis not present

## 2019-11-25 DIAGNOSIS — F1120 Opioid dependence, uncomplicated: Secondary | ICD-10-CM | POA: Diagnosis not present

## 2019-11-25 DIAGNOSIS — F1924 Other psychoactive substance dependence with psychoactive substance-induced mood disorder: Secondary | ICD-10-CM | POA: Diagnosis not present

## 2019-12-15 DIAGNOSIS — Z1231 Encounter for screening mammogram for malignant neoplasm of breast: Secondary | ICD-10-CM | POA: Diagnosis not present

## 2019-12-15 DIAGNOSIS — M85851 Other specified disorders of bone density and structure, right thigh: Secondary | ICD-10-CM | POA: Diagnosis not present

## 2019-12-15 DIAGNOSIS — M85852 Other specified disorders of bone density and structure, left thigh: Secondary | ICD-10-CM | POA: Diagnosis not present

## 2019-12-17 DIAGNOSIS — L309 Dermatitis, unspecified: Secondary | ICD-10-CM | POA: Diagnosis not present

## 2019-12-17 DIAGNOSIS — Z86008 Personal history of in-situ neoplasm of other site: Secondary | ICD-10-CM | POA: Diagnosis not present

## 2019-12-17 DIAGNOSIS — L821 Other seborrheic keratosis: Secondary | ICD-10-CM | POA: Diagnosis not present

## 2019-12-17 DIAGNOSIS — Z85828 Personal history of other malignant neoplasm of skin: Secondary | ICD-10-CM | POA: Diagnosis not present

## 2019-12-17 DIAGNOSIS — L603 Nail dystrophy: Secondary | ICD-10-CM | POA: Diagnosis not present

## 2019-12-17 DIAGNOSIS — L578 Other skin changes due to chronic exposure to nonionizing radiation: Secondary | ICD-10-CM | POA: Diagnosis not present

## 2019-12-17 DIAGNOSIS — D239 Other benign neoplasm of skin, unspecified: Secondary | ICD-10-CM | POA: Diagnosis not present

## 2020-06-06 DIAGNOSIS — F1120 Opioid dependence, uncomplicated: Secondary | ICD-10-CM | POA: Diagnosis not present

## 2020-06-06 DIAGNOSIS — F331 Major depressive disorder, recurrent, moderate: Secondary | ICD-10-CM | POA: Diagnosis not present

## 2020-06-06 DIAGNOSIS — F1924 Other psychoactive substance dependence with psychoactive substance-induced mood disorder: Secondary | ICD-10-CM | POA: Diagnosis not present

## 2020-06-06 DIAGNOSIS — F411 Generalized anxiety disorder: Secondary | ICD-10-CM | POA: Diagnosis not present

## 2020-07-25 DIAGNOSIS — M8580 Other specified disorders of bone density and structure, unspecified site: Secondary | ICD-10-CM | POA: Diagnosis not present

## 2020-07-25 DIAGNOSIS — Z79899 Other long term (current) drug therapy: Secondary | ICD-10-CM | POA: Diagnosis not present

## 2020-07-25 DIAGNOSIS — E7800 Pure hypercholesterolemia, unspecified: Secondary | ICD-10-CM | POA: Diagnosis not present

## 2020-07-26 DIAGNOSIS — E7800 Pure hypercholesterolemia, unspecified: Secondary | ICD-10-CM | POA: Diagnosis not present

## 2020-07-26 DIAGNOSIS — Z8679 Personal history of other diseases of the circulatory system: Secondary | ICD-10-CM | POA: Diagnosis not present

## 2020-07-26 DIAGNOSIS — M8580 Other specified disorders of bone density and structure, unspecified site: Secondary | ICD-10-CM | POA: Diagnosis not present

## 2020-07-26 DIAGNOSIS — Z79899 Other long term (current) drug therapy: Secondary | ICD-10-CM | POA: Diagnosis not present

## 2020-07-26 DIAGNOSIS — Z9889 Other specified postprocedural states: Secondary | ICD-10-CM | POA: Diagnosis not present

## 2020-07-26 DIAGNOSIS — G43909 Migraine, unspecified, not intractable, without status migrainosus: Secondary | ICD-10-CM | POA: Diagnosis not present

## 2020-07-26 DIAGNOSIS — Z0000 Encounter for general adult medical examination without abnormal findings: Secondary | ICD-10-CM | POA: Diagnosis not present

## 2020-07-26 DIAGNOSIS — F330 Major depressive disorder, recurrent, mild: Secondary | ICD-10-CM | POA: Diagnosis not present

## 2020-08-12 DIAGNOSIS — Z719 Counseling, unspecified: Secondary | ICD-10-CM

## 2020-11-24 DIAGNOSIS — F411 Generalized anxiety disorder: Secondary | ICD-10-CM | POA: Diagnosis not present

## 2020-11-24 DIAGNOSIS — F331 Major depressive disorder, recurrent, moderate: Secondary | ICD-10-CM | POA: Diagnosis not present

## 2020-11-24 DIAGNOSIS — F1024 Alcohol dependence with alcohol-induced mood disorder: Secondary | ICD-10-CM | POA: Diagnosis not present

## 2020-12-14 DIAGNOSIS — H2513 Age-related nuclear cataract, bilateral: Secondary | ICD-10-CM | POA: Diagnosis not present

## 2020-12-14 DIAGNOSIS — H524 Presbyopia: Secondary | ICD-10-CM | POA: Diagnosis not present

## 2020-12-14 DIAGNOSIS — H35372 Puckering of macula, left eye: Secondary | ICD-10-CM | POA: Diagnosis not present

## 2020-12-14 DIAGNOSIS — H5203 Hypermetropia, bilateral: Secondary | ICD-10-CM | POA: Diagnosis not present

## 2020-12-20 DIAGNOSIS — Z719 Counseling, unspecified: Secondary | ICD-10-CM

## 2020-12-21 DIAGNOSIS — Z85828 Personal history of other malignant neoplasm of skin: Secondary | ICD-10-CM | POA: Diagnosis not present

## 2020-12-21 DIAGNOSIS — Z86008 Personal history of in-situ neoplasm of other site: Secondary | ICD-10-CM | POA: Diagnosis not present

## 2020-12-21 DIAGNOSIS — L608 Other nail disorders: Secondary | ICD-10-CM | POA: Diagnosis not present

## 2020-12-21 DIAGNOSIS — L821 Other seborrheic keratosis: Secondary | ICD-10-CM | POA: Diagnosis not present

## 2020-12-21 DIAGNOSIS — D224 Melanocytic nevi of scalp and neck: Secondary | ICD-10-CM | POA: Diagnosis not present

## 2020-12-21 DIAGNOSIS — L719 Rosacea, unspecified: Secondary | ICD-10-CM | POA: Diagnosis not present

## 2020-12-21 DIAGNOSIS — D225 Melanocytic nevi of trunk: Secondary | ICD-10-CM | POA: Diagnosis not present

## 2020-12-21 DIAGNOSIS — L578 Other skin changes due to chronic exposure to nonionizing radiation: Secondary | ICD-10-CM | POA: Diagnosis not present

## 2020-12-27 DIAGNOSIS — Z1231 Encounter for screening mammogram for malignant neoplasm of breast: Secondary | ICD-10-CM | POA: Diagnosis not present

## 2021-01-10 DIAGNOSIS — Z23 Encounter for immunization: Secondary | ICD-10-CM | POA: Diagnosis not present

## 2021-01-25 DIAGNOSIS — F331 Major depressive disorder, recurrent, moderate: Secondary | ICD-10-CM | POA: Diagnosis not present

## 2021-01-25 DIAGNOSIS — F411 Generalized anxiety disorder: Secondary | ICD-10-CM | POA: Diagnosis not present

## 2021-01-25 DIAGNOSIS — F1024 Alcohol dependence with alcohol-induced mood disorder: Secondary | ICD-10-CM | POA: Diagnosis not present

## 2021-04-13 DIAGNOSIS — Z719 Counseling, unspecified: Secondary | ICD-10-CM

## 2021-04-25 DIAGNOSIS — M7582 Other shoulder lesions, left shoulder: Secondary | ICD-10-CM | POA: Diagnosis not present

## 2021-05-03 DIAGNOSIS — F1024 Alcohol dependence with alcohol-induced mood disorder: Secondary | ICD-10-CM | POA: Diagnosis not present

## 2021-05-03 DIAGNOSIS — F411 Generalized anxiety disorder: Secondary | ICD-10-CM | POA: Diagnosis not present

## 2021-05-03 DIAGNOSIS — F331 Major depressive disorder, recurrent, moderate: Secondary | ICD-10-CM | POA: Diagnosis not present

## 2021-06-19 DIAGNOSIS — Z719 Counseling, unspecified: Secondary | ICD-10-CM

## 2021-07-12 DIAGNOSIS — Z01411 Encounter for gynecological examination (general) (routine) with abnormal findings: Secondary | ICD-10-CM | POA: Diagnosis not present

## 2021-07-12 DIAGNOSIS — Z124 Encounter for screening for malignant neoplasm of cervix: Secondary | ICD-10-CM | POA: Diagnosis not present

## 2021-07-12 DIAGNOSIS — Z01419 Encounter for gynecological examination (general) (routine) without abnormal findings: Secondary | ICD-10-CM | POA: Diagnosis not present

## 2021-07-12 DIAGNOSIS — Z6824 Body mass index (BMI) 24.0-24.9, adult: Secondary | ICD-10-CM | POA: Diagnosis not present

## 2021-07-12 DIAGNOSIS — Z719 Counseling, unspecified: Secondary | ICD-10-CM

## 2021-07-12 DIAGNOSIS — M8580 Other specified disorders of bone density and structure, unspecified site: Secondary | ICD-10-CM | POA: Diagnosis not present

## 2021-07-12 DIAGNOSIS — L900 Lichen sclerosus et atrophicus: Secondary | ICD-10-CM | POA: Diagnosis not present

## 2021-07-24 DIAGNOSIS — F411 Generalized anxiety disorder: Secondary | ICD-10-CM | POA: Diagnosis not present

## 2021-07-24 DIAGNOSIS — F331 Major depressive disorder, recurrent, moderate: Secondary | ICD-10-CM | POA: Diagnosis not present

## 2021-07-24 DIAGNOSIS — F1021 Alcohol dependence, in remission: Secondary | ICD-10-CM | POA: Diagnosis not present

## 2021-08-15 DIAGNOSIS — Z719 Counseling, unspecified: Secondary | ICD-10-CM

## 2021-08-22 DIAGNOSIS — M8580 Other specified disorders of bone density and structure, unspecified site: Secondary | ICD-10-CM | POA: Diagnosis not present

## 2021-08-22 DIAGNOSIS — M8588 Other specified disorders of bone density and structure, other site: Secondary | ICD-10-CM | POA: Diagnosis not present

## 2021-08-22 DIAGNOSIS — E7800 Pure hypercholesterolemia, unspecified: Secondary | ICD-10-CM | POA: Diagnosis not present

## 2021-08-22 DIAGNOSIS — Z79899 Other long term (current) drug therapy: Secondary | ICD-10-CM | POA: Diagnosis not present

## 2021-08-23 DIAGNOSIS — Z79899 Other long term (current) drug therapy: Secondary | ICD-10-CM | POA: Diagnosis not present

## 2021-08-23 DIAGNOSIS — G43909 Migraine, unspecified, not intractable, without status migrainosus: Secondary | ICD-10-CM | POA: Diagnosis not present

## 2021-08-23 DIAGNOSIS — M8580 Other specified disorders of bone density and structure, unspecified site: Secondary | ICD-10-CM | POA: Diagnosis not present

## 2021-08-23 DIAGNOSIS — Z0000 Encounter for general adult medical examination without abnormal findings: Secondary | ICD-10-CM | POA: Diagnosis not present

## 2021-08-23 DIAGNOSIS — F324 Major depressive disorder, single episode, in partial remission: Secondary | ICD-10-CM | POA: Diagnosis not present

## 2021-08-23 DIAGNOSIS — E7800 Pure hypercholesterolemia, unspecified: Secondary | ICD-10-CM | POA: Diagnosis not present

## 2021-08-23 DIAGNOSIS — Z8679 Personal history of other diseases of the circulatory system: Secondary | ICD-10-CM | POA: Diagnosis not present

## 2021-08-23 DIAGNOSIS — K219 Gastro-esophageal reflux disease without esophagitis: Secondary | ICD-10-CM | POA: Diagnosis not present

## 2021-08-23 DIAGNOSIS — M797 Fibromyalgia: Secondary | ICD-10-CM | POA: Diagnosis not present

## 2021-09-13 DIAGNOSIS — H6122 Impacted cerumen, left ear: Secondary | ICD-10-CM | POA: Diagnosis not present

## 2021-10-06 DIAGNOSIS — M549 Dorsalgia, unspecified: Secondary | ICD-10-CM | POA: Diagnosis not present

## 2021-10-06 DIAGNOSIS — H938X9 Other specified disorders of ear, unspecified ear: Secondary | ICD-10-CM | POA: Diagnosis not present

## 2021-10-06 DIAGNOSIS — Z01818 Encounter for other preprocedural examination: Secondary | ICD-10-CM | POA: Diagnosis not present

## 2021-10-06 DIAGNOSIS — Z5181 Encounter for therapeutic drug level monitoring: Secondary | ICD-10-CM | POA: Diagnosis not present

## 2021-11-07 DIAGNOSIS — M549 Dorsalgia, unspecified: Secondary | ICD-10-CM | POA: Diagnosis not present

## 2021-11-07 DIAGNOSIS — M545 Low back pain, unspecified: Secondary | ICD-10-CM | POA: Diagnosis not present

## 2021-11-07 DIAGNOSIS — M797 Fibromyalgia: Secondary | ICD-10-CM | POA: Diagnosis not present

## 2021-11-13 DIAGNOSIS — F411 Generalized anxiety disorder: Secondary | ICD-10-CM | POA: Diagnosis not present

## 2021-11-13 DIAGNOSIS — F331 Major depressive disorder, recurrent, moderate: Secondary | ICD-10-CM | POA: Diagnosis not present

## 2021-11-13 DIAGNOSIS — F1024 Alcohol dependence with alcohol-induced mood disorder: Secondary | ICD-10-CM | POA: Diagnosis not present

## 2021-11-17 DIAGNOSIS — M545 Low back pain, unspecified: Secondary | ICD-10-CM | POA: Diagnosis not present

## 2021-11-17 DIAGNOSIS — M797 Fibromyalgia: Secondary | ICD-10-CM | POA: Diagnosis not present

## 2021-11-17 DIAGNOSIS — M549 Dorsalgia, unspecified: Secondary | ICD-10-CM | POA: Diagnosis not present

## 2021-11-24 DIAGNOSIS — M545 Low back pain, unspecified: Secondary | ICD-10-CM | POA: Diagnosis not present

## 2021-11-24 DIAGNOSIS — M549 Dorsalgia, unspecified: Secondary | ICD-10-CM | POA: Diagnosis not present

## 2021-11-24 DIAGNOSIS — M797 Fibromyalgia: Secondary | ICD-10-CM | POA: Diagnosis not present

## 2021-12-01 DIAGNOSIS — M797 Fibromyalgia: Secondary | ICD-10-CM | POA: Diagnosis not present

## 2021-12-01 DIAGNOSIS — M549 Dorsalgia, unspecified: Secondary | ICD-10-CM | POA: Diagnosis not present

## 2021-12-01 DIAGNOSIS — M545 Low back pain, unspecified: Secondary | ICD-10-CM | POA: Diagnosis not present

## 2021-12-08 DIAGNOSIS — M549 Dorsalgia, unspecified: Secondary | ICD-10-CM | POA: Diagnosis not present

## 2021-12-08 DIAGNOSIS — M545 Low back pain, unspecified: Secondary | ICD-10-CM | POA: Diagnosis not present

## 2021-12-08 DIAGNOSIS — M797 Fibromyalgia: Secondary | ICD-10-CM | POA: Diagnosis not present

## 2022-01-02 DIAGNOSIS — Z23 Encounter for immunization: Secondary | ICD-10-CM | POA: Diagnosis not present

## 2022-01-22 DIAGNOSIS — Z1231 Encounter for screening mammogram for malignant neoplasm of breast: Secondary | ICD-10-CM | POA: Diagnosis not present

## 2022-01-22 DIAGNOSIS — M8589 Other specified disorders of bone density and structure, multiple sites: Secondary | ICD-10-CM | POA: Diagnosis not present

## 2022-02-19 DIAGNOSIS — F1020 Alcohol dependence, uncomplicated: Secondary | ICD-10-CM | POA: Diagnosis not present

## 2022-02-19 DIAGNOSIS — F331 Major depressive disorder, recurrent, moderate: Secondary | ICD-10-CM | POA: Diagnosis not present

## 2022-02-19 DIAGNOSIS — F411 Generalized anxiety disorder: Secondary | ICD-10-CM | POA: Diagnosis not present

## 2022-03-01 ENCOUNTER — Telehealth: Payer: Self-pay

## 2022-03-01 DIAGNOSIS — Z719 Counseling, unspecified: Secondary | ICD-10-CM

## 2022-03-01 NOTE — Telephone Encounter (Signed)
LVM advising the price of ZO Skin Health Brightening and Tretinoin 0.5% asked for a call back if pt is still interested.

## 2022-03-02 ENCOUNTER — Telehealth: Payer: Self-pay | Admitting: Plastic Surgery

## 2022-03-02 DIAGNOSIS — Z719 Counseling, unspecified: Secondary | ICD-10-CM

## 2022-03-02 NOTE — Telephone Encounter (Signed)
LVM that Zo Skin pigment has come in and she can come by to pay and pick up.

## 2022-06-06 DIAGNOSIS — D224 Melanocytic nevi of scalp and neck: Secondary | ICD-10-CM | POA: Diagnosis not present

## 2022-06-06 DIAGNOSIS — L578 Other skin changes due to chronic exposure to nonionizing radiation: Secondary | ICD-10-CM | POA: Diagnosis not present

## 2022-06-06 DIAGNOSIS — D225 Melanocytic nevi of trunk: Secondary | ICD-10-CM | POA: Diagnosis not present

## 2022-06-06 DIAGNOSIS — Z86008 Personal history of in-situ neoplasm of other site: Secondary | ICD-10-CM | POA: Diagnosis not present

## 2022-06-06 DIAGNOSIS — L570 Actinic keratosis: Secondary | ICD-10-CM | POA: Diagnosis not present

## 2022-06-06 DIAGNOSIS — Z85828 Personal history of other malignant neoplasm of skin: Secondary | ICD-10-CM | POA: Diagnosis not present

## 2022-06-06 DIAGNOSIS — L821 Other seborrheic keratosis: Secondary | ICD-10-CM | POA: Diagnosis not present

## 2022-06-06 DIAGNOSIS — D239 Other benign neoplasm of skin, unspecified: Secondary | ICD-10-CM | POA: Diagnosis not present

## 2022-06-06 DIAGNOSIS — L719 Rosacea, unspecified: Secondary | ICD-10-CM | POA: Diagnosis not present

## 2022-06-11 DIAGNOSIS — H903 Sensorineural hearing loss, bilateral: Secondary | ICD-10-CM | POA: Diagnosis not present

## 2022-06-18 DIAGNOSIS — F331 Major depressive disorder, recurrent, moderate: Secondary | ICD-10-CM | POA: Diagnosis not present

## 2022-06-18 DIAGNOSIS — F411 Generalized anxiety disorder: Secondary | ICD-10-CM | POA: Diagnosis not present

## 2022-06-18 DIAGNOSIS — F1024 Alcohol dependence with alcohol-induced mood disorder: Secondary | ICD-10-CM | POA: Diagnosis not present

## 2022-07-17 DIAGNOSIS — Z124 Encounter for screening for malignant neoplasm of cervix: Secondary | ICD-10-CM | POA: Diagnosis not present

## 2022-07-17 DIAGNOSIS — Z01411 Encounter for gynecological examination (general) (routine) with abnormal findings: Secondary | ICD-10-CM | POA: Diagnosis not present

## 2022-07-17 DIAGNOSIS — Z01419 Encounter for gynecological examination (general) (routine) without abnormal findings: Secondary | ICD-10-CM | POA: Diagnosis not present

## 2022-07-17 DIAGNOSIS — Z6825 Body mass index (BMI) 25.0-25.9, adult: Secondary | ICD-10-CM | POA: Diagnosis not present

## 2022-07-31 DIAGNOSIS — F1024 Alcohol dependence with alcohol-induced mood disorder: Secondary | ICD-10-CM | POA: Diagnosis not present

## 2022-07-31 DIAGNOSIS — F411 Generalized anxiety disorder: Secondary | ICD-10-CM | POA: Diagnosis not present

## 2022-07-31 DIAGNOSIS — F331 Major depressive disorder, recurrent, moderate: Secondary | ICD-10-CM | POA: Diagnosis not present

## 2022-08-27 DIAGNOSIS — M8588 Other specified disorders of bone density and structure, other site: Secondary | ICD-10-CM | POA: Diagnosis not present

## 2022-08-27 DIAGNOSIS — E7800 Pure hypercholesterolemia, unspecified: Secondary | ICD-10-CM | POA: Diagnosis not present

## 2022-08-27 DIAGNOSIS — Z79899 Other long term (current) drug therapy: Secondary | ICD-10-CM | POA: Diagnosis not present

## 2022-08-28 DIAGNOSIS — M8588 Other specified disorders of bone density and structure, other site: Secondary | ICD-10-CM | POA: Diagnosis not present

## 2022-08-28 DIAGNOSIS — E7800 Pure hypercholesterolemia, unspecified: Secondary | ICD-10-CM | POA: Diagnosis not present

## 2022-08-28 DIAGNOSIS — Z0000 Encounter for general adult medical examination without abnormal findings: Secondary | ICD-10-CM | POA: Diagnosis not present

## 2022-08-28 DIAGNOSIS — Z8679 Personal history of other diseases of the circulatory system: Secondary | ICD-10-CM | POA: Diagnosis not present

## 2022-08-28 DIAGNOSIS — F324 Major depressive disorder, single episode, in partial remission: Secondary | ICD-10-CM | POA: Diagnosis not present

## 2022-08-28 DIAGNOSIS — I429 Cardiomyopathy, unspecified: Secondary | ICD-10-CM | POA: Diagnosis not present

## 2022-08-28 DIAGNOSIS — Z79899 Other long term (current) drug therapy: Secondary | ICD-10-CM | POA: Diagnosis not present

## 2022-09-05 DIAGNOSIS — M79642 Pain in left hand: Secondary | ICD-10-CM | POA: Diagnosis not present

## 2022-09-05 DIAGNOSIS — M79641 Pain in right hand: Secondary | ICD-10-CM | POA: Diagnosis not present

## 2022-10-01 DIAGNOSIS — R32 Unspecified urinary incontinence: Secondary | ICD-10-CM | POA: Diagnosis not present

## 2022-10-01 DIAGNOSIS — M8580 Other specified disorders of bone density and structure, unspecified site: Secondary | ICD-10-CM | POA: Diagnosis not present

## 2022-10-01 DIAGNOSIS — F411 Generalized anxiety disorder: Secondary | ICD-10-CM | POA: Diagnosis not present

## 2022-10-01 DIAGNOSIS — K5900 Constipation, unspecified: Secondary | ICD-10-CM | POA: Diagnosis not present

## 2022-10-01 DIAGNOSIS — G4700 Insomnia, unspecified: Secondary | ICD-10-CM | POA: Diagnosis not present

## 2022-10-01 DIAGNOSIS — R030 Elevated blood-pressure reading, without diagnosis of hypertension: Secondary | ICD-10-CM | POA: Diagnosis not present

## 2022-10-01 DIAGNOSIS — E785 Hyperlipidemia, unspecified: Secondary | ICD-10-CM | POA: Diagnosis not present

## 2022-10-01 DIAGNOSIS — I429 Cardiomyopathy, unspecified: Secondary | ICD-10-CM | POA: Diagnosis not present

## 2022-10-01 DIAGNOSIS — F324 Major depressive disorder, single episode, in partial remission: Secondary | ICD-10-CM | POA: Diagnosis not present

## 2022-10-17 DIAGNOSIS — F1024 Alcohol dependence with alcohol-induced mood disorder: Secondary | ICD-10-CM | POA: Diagnosis not present

## 2022-10-17 DIAGNOSIS — F331 Major depressive disorder, recurrent, moderate: Secondary | ICD-10-CM | POA: Diagnosis not present

## 2022-10-17 DIAGNOSIS — F411 Generalized anxiety disorder: Secondary | ICD-10-CM | POA: Diagnosis not present

## 2022-11-21 DIAGNOSIS — H25813 Combined forms of age-related cataract, bilateral: Secondary | ICD-10-CM | POA: Diagnosis not present

## 2023-01-21 DIAGNOSIS — F1024 Alcohol dependence with alcohol-induced mood disorder: Secondary | ICD-10-CM | POA: Diagnosis not present

## 2023-01-21 DIAGNOSIS — F411 Generalized anxiety disorder: Secondary | ICD-10-CM | POA: Diagnosis not present

## 2023-01-21 DIAGNOSIS — F331 Major depressive disorder, recurrent, moderate: Secondary | ICD-10-CM | POA: Diagnosis not present

## 2023-01-30 DIAGNOSIS — Z1231 Encounter for screening mammogram for malignant neoplasm of breast: Secondary | ICD-10-CM | POA: Diagnosis not present

## 2023-03-06 DIAGNOSIS — H25811 Combined forms of age-related cataract, right eye: Secondary | ICD-10-CM | POA: Diagnosis not present

## 2023-03-20 DIAGNOSIS — M25652 Stiffness of left hip, not elsewhere classified: Secondary | ICD-10-CM | POA: Diagnosis not present

## 2023-03-20 DIAGNOSIS — M25551 Pain in right hip: Secondary | ICD-10-CM | POA: Diagnosis not present

## 2023-03-20 DIAGNOSIS — M25651 Stiffness of right hip, not elsewhere classified: Secondary | ICD-10-CM | POA: Diagnosis not present

## 2023-03-20 DIAGNOSIS — M25552 Pain in left hip: Secondary | ICD-10-CM | POA: Diagnosis not present

## 2023-03-20 DIAGNOSIS — M532X7 Spinal instabilities, lumbosacral region: Secondary | ICD-10-CM | POA: Diagnosis not present

## 2023-04-01 DIAGNOSIS — F331 Major depressive disorder, recurrent, moderate: Secondary | ICD-10-CM | POA: Diagnosis not present

## 2023-04-01 DIAGNOSIS — F411 Generalized anxiety disorder: Secondary | ICD-10-CM | POA: Diagnosis not present

## 2023-04-02 DIAGNOSIS — M532X7 Spinal instabilities, lumbosacral region: Secondary | ICD-10-CM | POA: Diagnosis not present

## 2023-04-02 DIAGNOSIS — M25652 Stiffness of left hip, not elsewhere classified: Secondary | ICD-10-CM | POA: Diagnosis not present

## 2023-04-02 DIAGNOSIS — M25651 Stiffness of right hip, not elsewhere classified: Secondary | ICD-10-CM | POA: Diagnosis not present

## 2023-04-02 DIAGNOSIS — M25551 Pain in right hip: Secondary | ICD-10-CM | POA: Diagnosis not present

## 2023-04-02 DIAGNOSIS — M25552 Pain in left hip: Secondary | ICD-10-CM | POA: Diagnosis not present

## 2023-04-05 DIAGNOSIS — M532X7 Spinal instabilities, lumbosacral region: Secondary | ICD-10-CM | POA: Diagnosis not present

## 2023-04-05 DIAGNOSIS — M25651 Stiffness of right hip, not elsewhere classified: Secondary | ICD-10-CM | POA: Diagnosis not present

## 2023-04-05 DIAGNOSIS — M25552 Pain in left hip: Secondary | ICD-10-CM | POA: Diagnosis not present

## 2023-04-05 DIAGNOSIS — M25551 Pain in right hip: Secondary | ICD-10-CM | POA: Diagnosis not present

## 2023-04-05 DIAGNOSIS — M25652 Stiffness of left hip, not elsewhere classified: Secondary | ICD-10-CM | POA: Diagnosis not present

## 2023-04-10 DIAGNOSIS — M25552 Pain in left hip: Secondary | ICD-10-CM | POA: Diagnosis not present

## 2023-04-10 DIAGNOSIS — M532X7 Spinal instabilities, lumbosacral region: Secondary | ICD-10-CM | POA: Diagnosis not present

## 2023-04-10 DIAGNOSIS — M25652 Stiffness of left hip, not elsewhere classified: Secondary | ICD-10-CM | POA: Diagnosis not present

## 2023-04-10 DIAGNOSIS — M25551 Pain in right hip: Secondary | ICD-10-CM | POA: Diagnosis not present

## 2023-04-10 DIAGNOSIS — M25651 Stiffness of right hip, not elsewhere classified: Secondary | ICD-10-CM | POA: Diagnosis not present

## 2023-04-12 DIAGNOSIS — H52221 Regular astigmatism, right eye: Secondary | ICD-10-CM | POA: Diagnosis not present

## 2023-04-12 DIAGNOSIS — H25811 Combined forms of age-related cataract, right eye: Secondary | ICD-10-CM | POA: Diagnosis not present

## 2023-04-15 DIAGNOSIS — M25552 Pain in left hip: Secondary | ICD-10-CM | POA: Diagnosis not present

## 2023-04-15 DIAGNOSIS — M25652 Stiffness of left hip, not elsewhere classified: Secondary | ICD-10-CM | POA: Diagnosis not present

## 2023-04-15 DIAGNOSIS — M25651 Stiffness of right hip, not elsewhere classified: Secondary | ICD-10-CM | POA: Diagnosis not present

## 2023-04-15 DIAGNOSIS — M532X7 Spinal instabilities, lumbosacral region: Secondary | ICD-10-CM | POA: Diagnosis not present

## 2023-04-15 DIAGNOSIS — M25551 Pain in right hip: Secondary | ICD-10-CM | POA: Diagnosis not present

## 2023-04-26 DIAGNOSIS — M25552 Pain in left hip: Secondary | ICD-10-CM | POA: Diagnosis not present

## 2023-04-26 DIAGNOSIS — M532X7 Spinal instabilities, lumbosacral region: Secondary | ICD-10-CM | POA: Diagnosis not present

## 2023-04-26 DIAGNOSIS — M25651 Stiffness of right hip, not elsewhere classified: Secondary | ICD-10-CM | POA: Diagnosis not present

## 2023-04-26 DIAGNOSIS — M25652 Stiffness of left hip, not elsewhere classified: Secondary | ICD-10-CM | POA: Diagnosis not present

## 2023-04-26 DIAGNOSIS — M25551 Pain in right hip: Secondary | ICD-10-CM | POA: Diagnosis not present

## 2023-05-07 DIAGNOSIS — M25551 Pain in right hip: Secondary | ICD-10-CM | POA: Diagnosis not present

## 2023-05-07 DIAGNOSIS — M25651 Stiffness of right hip, not elsewhere classified: Secondary | ICD-10-CM | POA: Diagnosis not present

## 2023-05-07 DIAGNOSIS — M25552 Pain in left hip: Secondary | ICD-10-CM | POA: Diagnosis not present

## 2023-05-07 DIAGNOSIS — M25652 Stiffness of left hip, not elsewhere classified: Secondary | ICD-10-CM | POA: Diagnosis not present

## 2023-05-07 DIAGNOSIS — M532X7 Spinal instabilities, lumbosacral region: Secondary | ICD-10-CM | POA: Diagnosis not present

## 2023-05-15 DIAGNOSIS — M25552 Pain in left hip: Secondary | ICD-10-CM | POA: Diagnosis not present

## 2023-05-15 DIAGNOSIS — M25652 Stiffness of left hip, not elsewhere classified: Secondary | ICD-10-CM | POA: Diagnosis not present

## 2023-05-15 DIAGNOSIS — M25651 Stiffness of right hip, not elsewhere classified: Secondary | ICD-10-CM | POA: Diagnosis not present

## 2023-05-15 DIAGNOSIS — M532X7 Spinal instabilities, lumbosacral region: Secondary | ICD-10-CM | POA: Diagnosis not present

## 2023-05-15 DIAGNOSIS — M25551 Pain in right hip: Secondary | ICD-10-CM | POA: Diagnosis not present

## 2023-05-21 DIAGNOSIS — M532X7 Spinal instabilities, lumbosacral region: Secondary | ICD-10-CM | POA: Diagnosis not present

## 2023-05-21 DIAGNOSIS — M25551 Pain in right hip: Secondary | ICD-10-CM | POA: Diagnosis not present

## 2023-05-21 DIAGNOSIS — M25651 Stiffness of right hip, not elsewhere classified: Secondary | ICD-10-CM | POA: Diagnosis not present

## 2023-05-21 DIAGNOSIS — M25552 Pain in left hip: Secondary | ICD-10-CM | POA: Diagnosis not present

## 2023-05-21 DIAGNOSIS — M25652 Stiffness of left hip, not elsewhere classified: Secondary | ICD-10-CM | POA: Diagnosis not present

## 2023-05-24 DIAGNOSIS — H52222 Regular astigmatism, left eye: Secondary | ICD-10-CM | POA: Diagnosis not present

## 2023-05-24 DIAGNOSIS — H25812 Combined forms of age-related cataract, left eye: Secondary | ICD-10-CM | POA: Diagnosis not present

## 2023-07-08 DIAGNOSIS — F411 Generalized anxiety disorder: Secondary | ICD-10-CM | POA: Diagnosis not present

## 2023-07-08 DIAGNOSIS — F331 Major depressive disorder, recurrent, moderate: Secondary | ICD-10-CM | POA: Diagnosis not present

## 2023-08-15 DIAGNOSIS — Z86008 Personal history of in-situ neoplasm of other site: Secondary | ICD-10-CM | POA: Diagnosis not present

## 2023-08-15 DIAGNOSIS — L821 Other seborrheic keratosis: Secondary | ICD-10-CM | POA: Diagnosis not present

## 2023-08-15 DIAGNOSIS — L719 Rosacea, unspecified: Secondary | ICD-10-CM | POA: Diagnosis not present

## 2023-08-15 DIAGNOSIS — Z85828 Personal history of other malignant neoplasm of skin: Secondary | ICD-10-CM | POA: Diagnosis not present

## 2023-08-15 DIAGNOSIS — L578 Other skin changes due to chronic exposure to nonionizing radiation: Secondary | ICD-10-CM | POA: Diagnosis not present

## 2023-08-15 DIAGNOSIS — L570 Actinic keratosis: Secondary | ICD-10-CM | POA: Diagnosis not present

## 2023-08-15 DIAGNOSIS — D224 Melanocytic nevi of scalp and neck: Secondary | ICD-10-CM | POA: Diagnosis not present

## 2023-08-15 DIAGNOSIS — D225 Melanocytic nevi of trunk: Secondary | ICD-10-CM | POA: Diagnosis not present

## 2023-08-28 DIAGNOSIS — E7800 Pure hypercholesterolemia, unspecified: Secondary | ICD-10-CM | POA: Diagnosis not present

## 2023-08-28 DIAGNOSIS — M8588 Other specified disorders of bone density and structure, other site: Secondary | ICD-10-CM | POA: Diagnosis not present

## 2023-08-28 DIAGNOSIS — Z79899 Other long term (current) drug therapy: Secondary | ICD-10-CM | POA: Diagnosis not present

## 2023-08-28 DIAGNOSIS — M8580 Other specified disorders of bone density and structure, unspecified site: Secondary | ICD-10-CM | POA: Diagnosis not present

## 2023-08-30 DIAGNOSIS — F331 Major depressive disorder, recurrent, moderate: Secondary | ICD-10-CM | POA: Diagnosis not present

## 2023-08-30 DIAGNOSIS — M8580 Other specified disorders of bone density and structure, unspecified site: Secondary | ICD-10-CM | POA: Diagnosis not present

## 2023-08-30 DIAGNOSIS — Z0000 Encounter for general adult medical examination without abnormal findings: Secondary | ICD-10-CM | POA: Diagnosis not present

## 2023-08-30 DIAGNOSIS — Z9889 Other specified postprocedural states: Secondary | ICD-10-CM | POA: Diagnosis not present

## 2023-08-30 DIAGNOSIS — Z8679 Personal history of other diseases of the circulatory system: Secondary | ICD-10-CM | POA: Diagnosis not present

## 2023-08-30 DIAGNOSIS — E7800 Pure hypercholesterolemia, unspecified: Secondary | ICD-10-CM | POA: Diagnosis not present

## 2023-08-30 DIAGNOSIS — Z79899 Other long term (current) drug therapy: Secondary | ICD-10-CM | POA: Diagnosis not present

## 2023-09-25 DIAGNOSIS — F331 Major depressive disorder, recurrent, moderate: Secondary | ICD-10-CM | POA: Diagnosis not present

## 2023-09-25 DIAGNOSIS — F411 Generalized anxiety disorder: Secondary | ICD-10-CM | POA: Diagnosis not present

## 2023-09-25 DIAGNOSIS — F5101 Primary insomnia: Secondary | ICD-10-CM | POA: Diagnosis not present

## 2024-01-02 DIAGNOSIS — Z23 Encounter for immunization: Secondary | ICD-10-CM | POA: Diagnosis not present
